# Patient Record
Sex: Female | Born: 1965 | Race: White | Hispanic: No | Marital: Married | Smoking: Never smoker
Health system: Southern US, Community
[De-identification: ages and names within clinical notes are randomized; demographics above are authoritative.]

## PROBLEM LIST (undated history)

## (undated) DIAGNOSIS — C801 Malignant (primary) neoplasm, unspecified: Secondary | ICD-10-CM

## (undated) HISTORY — PX: BREAST BIOPSY: SHX20

## (undated) HISTORY — PX: BREAST EXCISIONAL BIOPSY: SUR124

---

## 2006-04-27 ENCOUNTER — Ambulatory Visit: Payer: Self-pay

## 2007-09-26 ENCOUNTER — Ambulatory Visit: Payer: Self-pay

## 2008-04-09 ENCOUNTER — Ambulatory Visit: Payer: Self-pay | Admitting: General Practice

## 2008-12-31 ENCOUNTER — Ambulatory Visit: Payer: Self-pay | Admitting: General Practice

## 2009-01-13 ENCOUNTER — Ambulatory Visit: Payer: Self-pay | Admitting: General Practice

## 2011-05-17 ENCOUNTER — Ambulatory Visit: Payer: Self-pay | Admitting: Internal Medicine

## 2011-12-08 ENCOUNTER — Ambulatory Visit: Payer: Self-pay | Admitting: Specialist

## 2011-12-09 ENCOUNTER — Ambulatory Visit: Payer: Self-pay | Admitting: Nurse Practitioner

## 2012-01-26 ENCOUNTER — Ambulatory Visit: Payer: Self-pay | Admitting: Internal Medicine

## 2012-02-02 ENCOUNTER — Ambulatory Visit: Payer: Self-pay | Admitting: Specialist

## 2012-02-15 ENCOUNTER — Ambulatory Visit: Payer: Self-pay | Admitting: Specialist

## 2012-06-14 ENCOUNTER — Ambulatory Visit: Payer: Self-pay | Admitting: Internal Medicine

## 2012-11-30 ENCOUNTER — Ambulatory Visit: Payer: Self-pay | Admitting: Adult Health

## 2013-02-04 ENCOUNTER — Ambulatory Visit: Payer: Self-pay | Admitting: Family Medicine

## 2014-02-05 ENCOUNTER — Ambulatory Visit: Payer: Self-pay | Admitting: Family Medicine

## 2014-02-11 ENCOUNTER — Ambulatory Visit: Payer: Self-pay | Admitting: Family Medicine

## 2014-05-09 NOTE — Op Note (Signed)
PATIENT NAME:  Sandy Cohen, Sandy Cohen MR#:  [id number] DATE OF BIRTH:  14-Apr-1965  DATE OF PROCEDURE:  02/15/2012  PREOPERATIVE DIAGNOSIS:   1.  Large V-shaped tear of the right rotator cuff (supraspinatus and infraspinatus).  2.  Arthritis of the right acromioclavicular joint.  3.  Severe impingement syndrome, right subacromial space, right shoulder.   POSTOPERATIVE DIAGNOSIS:   1.   Large V-shaped tear of the right rotator cuff (supraspinatus and infraspinatus).  2.  Arthritis of the right acromioclavicular joint.   3.  Severe impingement syndrome, right subacromial space, right shoulder.   PROCEDURES: 1.  Arthroscopic right rotator cuff repair.  2.  Arthroscopic right distal clavicle excision.  3.  Arthroscopic right shoulder subacromial decompression and partial acromioplasty.   SURGEON: Park Breed, M.D.   ANESTHESIA: General endotracheal plus interscalene block.   COMPLICATIONS: None.   DRAINS: None.   ESTIMATED BLOOD LOSS: Minimal.   REPLACEMENTS: None.   OPERATIVE FINDINGS: The patient had a large V-shaped tear of the rotator cuff starting just behind the biceps tendon and extending posteriorly into the infraspinatus. The cuff had retracted mostly posteriorly. The subacromial space showed extensive bursitis and prominence of the anterior acromion. There was spurring beneath the acromion. The glenohumeral joint showed  intact articular surfaces. The labrum was slightly frayed. The subscapularis was intact. The biceps tendon was normal. There was a large tear visible of the cuff visible from underneath.   DESCRIPTION OF PROCEDURE: The patient was brought to the operating room where she underwent satisfactory general endotracheal anesthesia, after a right interscalene block had been instilled. She was placed in the left lateral decubitus position and padded appropriately on the beanbag. The right shoulder was prepped and draped in sterile fashion and placed in 12 pounds of traction.  Arthroscopy was carried out from posterior portal with accessory portals laterally and anteriorly. The above findings were encountered upon entering the glenohumeral joint and the bursa. The large V-shaped tear edges were debrided with motorized shaver. The labrum was gently debrided. The arthroscope was redirected into the subacromial space and the bare area of the tuberosity, where a tendon had detached was freed of soft tissue and bur used to remove some of the superficial bone. Arthroscope was redirected to the lateral portal and the large bur was brought in posteriorly and the anterior acromion was removed along with some of the distal clavicle. The bur was then placed through an anterior portal and remainder of the distal clavicle excised. Once all the debridement had been carried out, it appeared that several convergence stitches would be necessary to pull the front and anterior and posterior flaps of the cuff back together before anchoring it to the bone. Using Magnum wire  sutures, 4 sutures were placed starting medially and coming laterally into the cuff reapproximating the anterior and posterior portions. A 5.5 speed screw was drilled, and a hole was tapped and the screw was inserted into the anterolateral portion of the tuberosity. Another Magnum wire suture was placed through the posterior cuff and the sutures tightened and brought down into the speed screw and with traction removed, the speed screw was deployed, bringing the good thick  posterior cuff up over the tuberosity snugly. This eliminated the defect in the lateral portion of the cuff. Once this was completed, the  joint was again irrigated and the stab wounds closed with a 3-0 nylon suture. 0.25% Marcaine with epinephrine and morphine was placed in the joint and the bursa. A dry sterile  dressing with TENS pads and a sling were applied. The patient was awakened and taken to recovery in good condition.   ____________________________ Park Breed, MD hem:cc D: 02/15/2012 16:50:18 ET T: 02/15/2012 18:07:14 ET JOB#: [id number]  cc: Park Breed, MD, <Dictator> Park Breed MD ELECTRONICALLY SIGNED 02/16/2012 13:28

## 2018-11-16 ENCOUNTER — Telehealth: Payer: Self-pay

## 2018-11-16 NOTE — Telephone Encounter (Signed)
Two attempts. Pre-visit screening call prior to [facility] appointment on 11/20/2018.

## 2018-11-19 ENCOUNTER — Other Ambulatory Visit: Payer: Self-pay

## 2018-11-20 ENCOUNTER — Other Ambulatory Visit: Payer: Self-pay

## 2018-11-20 ENCOUNTER — Other Ambulatory Visit: Payer: Self-pay | Admitting: *Deleted

## 2018-11-20 ENCOUNTER — Inpatient Hospital Stay
Admission: RE | Admit: 2018-11-20 | Discharge: 2018-11-20 | Disposition: A | Discharge status: Home / Self Care | Payer: Self-pay | Source: Ambulatory Visit | Attending: *Deleted | Admitting: *Deleted

## 2018-11-20 ENCOUNTER — Ambulatory Visit
Admission: RE | Admit: 2018-11-20 | Discharge: 2018-11-20 | Disposition: A | Discharge status: Home / Self Care | Payer: Self-pay | Source: Ambulatory Visit | Attending: Oncology | Admitting: Oncology

## 2018-11-20 ENCOUNTER — Ambulatory Visit: Payer: Self-pay | Attending: Oncology

## 2018-11-20 VITALS — BP 121/66 | HR 71 | Temp 97.4°F | Ht 64.0 in | Wt 249.0 lb

## 2018-11-20 DIAGNOSIS — Z1231 Encounter for screening mammogram for malignant neoplasm of breast: Secondary | ICD-10-CM

## 2018-11-20 DIAGNOSIS — Z0000 Encounter for general adult medical examination without abnormal findings: Secondary | ICD-10-CM | POA: Insufficient documentation

## 2018-11-20 HISTORY — DX: Malignant (primary) neoplasm, unspecified: C80.1

## 2018-11-20 NOTE — Progress Notes (Signed)
  Subjective:     Patient ID: Sandy Cohen, female   DOB: 04-25-1965, 53 y.o.   MRN: [id number]  HPI   Review of Systems     Objective:   Physical Exam Chest:     Breasts:        Right: No swelling, bleeding, inverted nipple, mass, nipple discharge, skin change or tenderness.        Left: No swelling, bleeding, inverted nipple, mass, nipple discharge, skin change or tenderness.       Comments: Biopsy scar       Assessment:     53 year old patient presents for BCCCP clinic visit.  Patient screened, and meets BCCCP eligibility.  Patient does not have insurance, Medicare or Medicaid. Instructed patient on breast self awareness using teach back method. Clinical breast exam unremarkable.  No mass or lump palpated.    Plan:     Sent for bilateral screening mammogram.

## 2018-12-25 ENCOUNTER — Encounter: Payer: Self-pay | Admitting: *Deleted

## 2019-01-07 NOTE — Progress Notes (Signed)
Letter mailed from [facility] to notify of normal mammogram results.  Patient to return in one year for annual screening.  Copy to [facility]. 

## 2020-05-05 ENCOUNTER — Other Ambulatory Visit: Payer: Self-pay | Admitting: Family Medicine

## 2020-05-05 DIAGNOSIS — Z1231 Encounter for screening mammogram for malignant neoplasm of breast: Secondary | ICD-10-CM

## 2020-06-02 ENCOUNTER — Ambulatory Visit
Admission: RE | Admit: 2020-06-02 | Discharge: 2020-06-02 | Disposition: A | Discharge status: Home / Self Care | Payer: Self-pay | Source: Ambulatory Visit | Attending: Oncology | Admitting: Oncology

## 2020-06-02 ENCOUNTER — Other Ambulatory Visit: Payer: Self-pay

## 2020-06-02 ENCOUNTER — Ambulatory Visit: Payer: Medicaid Other | Attending: Oncology

## 2020-06-02 VITALS — BP 128/78 | HR 64 | Temp 97.2°F | Ht 64.0 in | Wt 251.0 lb

## 2020-06-02 DIAGNOSIS — Z0000 Encounter for general adult medical examination without abnormal findings: Secondary | ICD-10-CM | POA: Insufficient documentation

## 2020-06-02 NOTE — Progress Notes (Signed)
  Subjective:     Patient ID: Sandy Cohen, female   DOB: 1965-11-21, 55 y.o.   MRN: [id number]  HPI   Review of Systems     Objective:   Physical Exam Chest:  Breasts:     Right: No swelling, bleeding, inverted nipple, mass, nipple discharge, skin change or tenderness.     Left: No swelling, bleeding, inverted nipple, mass, nipple discharge, skin change or tenderness.          Assessment:     55 year old patient presents for B[facility]CP clinic visit. Patient screened, and meets BCCCP eligibility.  Patient does not have insurance, Medicare or Medicaid. Instructed patient on breast self awareness using teach back method.  Clinical breast exam unremarkable.  No mass or lump palpated.   Risk Assessment    Risk Scores      06/02/2020 11/20/2018   Last edited by: Rico Junker, RN Rico Junker, RN   5-year risk: 1.8 % 1.6 %   Lifetime risk: 11.4 % 11.9 %        Also completed Harriett Rush.      Plan:     Sent for bilateral screening mammogram.

## 2020-06-03 NOTE — Progress Notes (Signed)
Letter mailed from [facility] to notify of normal mammogram results.  Patient to return in one year for annual screening.  Copy to [facility]. 

## 2021-12-17 ENCOUNTER — Other Ambulatory Visit: Payer: Self-pay

## 2021-12-17 DIAGNOSIS — Z1231 Encounter for screening mammogram for malignant neoplasm of breast: Secondary | ICD-10-CM

## 2021-12-20 ENCOUNTER — Ambulatory Visit
Admission: RE | Admit: 2021-12-20 | Discharge: 2021-12-20 | Disposition: A | Discharge status: Home / Self Care | Payer: Medicaid Other | Source: Ambulatory Visit | Attending: Obstetrics and Gynecology | Admitting: Obstetrics and Gynecology

## 2021-12-20 ENCOUNTER — Ambulatory Visit: Payer: Medicaid Other | Attending: Hematology and Oncology | Admitting: Hematology and Oncology

## 2021-12-20 VITALS — BP 134/81 | Wt 250.8 lb

## 2021-12-20 DIAGNOSIS — Z1231 Encounter for screening mammogram for malignant neoplasm of breast: Secondary | ICD-10-CM

## 2021-12-20 NOTE — Patient Instructions (Signed)
[place] about self breast awareness. Patient did not need a Pap smear today due to hysterectomy in 1995. [facility]. Appointment scheduled for 12/20/21. Patient aware of appointment and will be there. Let patient know will follow up with her within the next couple weeks with results. DEVON KINGDON verbalized understanding.  Melodye Ped, NP 1:46 PM

## 2021-12-20 NOTE — Progress Notes (Signed)
Sandy Cohen is a 56 y.o. female who presents to [facility] clinic today with no complaints.    Pap Smear: Pap smear not completed today. She had hysterectomy in 1995 for benign fibroids.    Physical exam: Breasts Breasts symmetrical. No skin abnormalities bilateral breasts. No nipple retraction bilateral breasts. No nipple discharge bilateral breasts. No lymphadenopathy. No lumps palpated bilateral breasts.     MS DIGITAL SCREENING TOMO BILATERAL  Result Date: 06/02/2020 CLINICAL DATA:  Screening. EXAM: DIGITAL SCREENING BILATERAL MAMMOGRAM WITH TOMOSYNTHESIS AND CAD TECHNIQUE: Bilateral screening digital craniocaudal and mediolateral oblique mammograms were obtained. Bilateral screening digital breast tomosynthesis was performed. The images were evaluated with computer-aided detection. COMPARISON:  Previous exam(s). ACR Breast Density Category b: There are scattered areas of fibroglandular density. FINDINGS: There are no findings suspicious for malignancy. The images were evaluated with computer-aided detection. IMPRESSION: No mammographic evidence of malignancy. A result letter of this screening mammogram will be mailed directly to the patient. RECOMMENDATION: Screening mammogram in one year. (Code:SM-B-01Y) BI-RADS CATEGORY  1: Negative. Electronically Signed   By: Ammie Ferrier M.D.   On: 06/02/2020 14:30   MS DIGITAL SCREENING TOMO BILATERAL  Result Date: 11/20/2018 CLINICAL DATA:  Screening. EXAM: DIGITAL SCREENING BILATERAL MAMMOGRAM WITH TOMO AND CAD COMPARISON:  Previous exam(s). ACR Breast Density Category b: There are scattered areas of fibroglandular density. FINDINGS: There are no findings suspicious for malignancy. Images were processed with CAD. IMPRESSION: No mammographic evidence of malignancy. A result letter of this screening mammogram will be mailed directly to the patient. RECOMMENDATION: Screening mammogram in one year. (Code:SM-B-01Y) BI-RADS CATEGORY  1: Negative.  Electronically Signed   By: Lajean Manes M.D.   On: 11/20/2018 14:32      Pelvic/Bimanual Pap is not indicated today    Smoking History: Patient has never smoked and was not referred to quit line.    Patient Navigation: Patient education provided. Access to services provided for patient through [facility] program. No interpreter provided. No transportation provided   Colorectal Cancer Screening: Per patient has had colonoscopy completed on 02/13/17 and is due soon for follow up. Right colon polyp-tubular adenoma, Rectal polyp- inflammatory polyp, negative for dysplasia.  She will call her PCP for appointment.  No complaints today.    Breast and Cervical Cancer Risk Assessment: Patient has family history of breast cancer, paternal grandmother. Patient does not have history of cervical dysplasia, immunocompromised, or DES exposure in-utero.  Risk Scores as of 12/20/2021     Sandy Cohen           5-year 1.49 %   Lifetime 9.08 %            Last calculated by Demetrius Revel, LPN on 88/01/1029 at  1:34 PM        A: B[facility]CP exam without pap smear No complaints with benign exam.   P: Referred patient to the [facility] for a screening mammogram. Appointment scheduled 12/20/21.  Melodye Ped, NP 12/20/2021 1:41 PM

## 2022-02-06 IMAGING — MG MM DIGITAL SCREENING BILAT W/ TOMO AND CAD
8 series · 8 of 24 positions shown · non-contrast
Comparison: Previous exam(s).

CLINICAL DATA: Screening.

EXAM:
DIGITAL SCREENING BILATERAL MAMMOGRAM WITH TOMOSYNTHESIS AND CAD
TECHNIQUE: Bilateral screening digital craniocaudal and mediolateral oblique
mammograms were obtained. Bilateral screening digital breast
tomosynthesis was performed. The images were evaluated with
computer-aided detection.

[L MLO synth-2D]
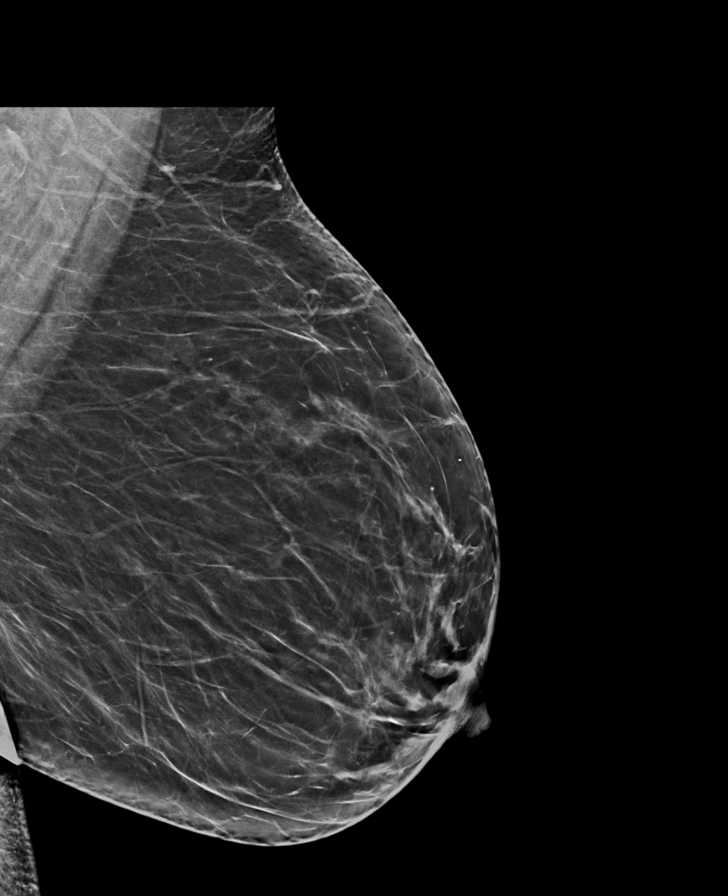

[R CC synth-2D]
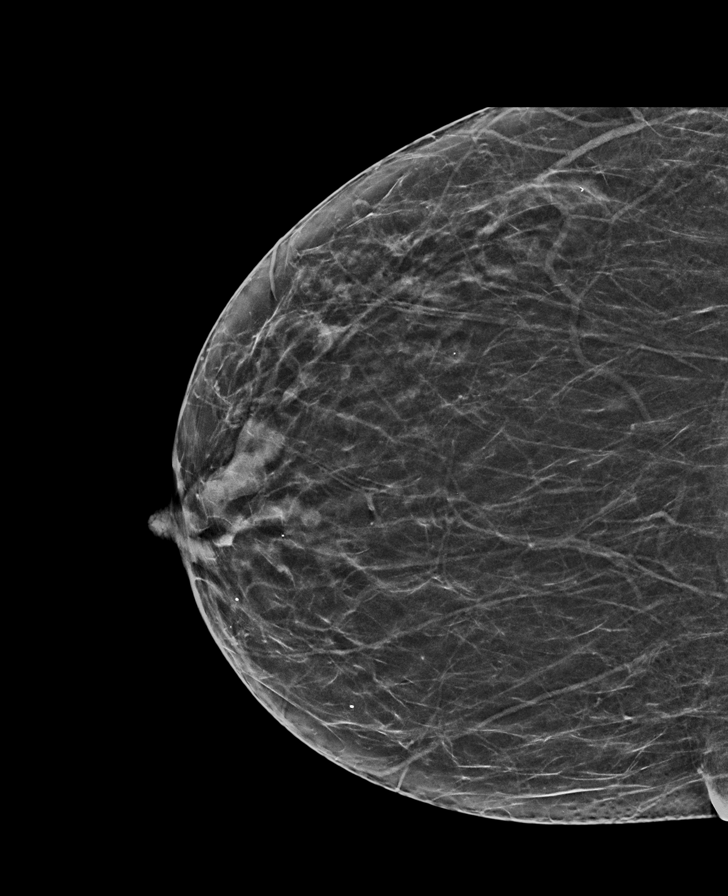

[R MLO synth-2D]
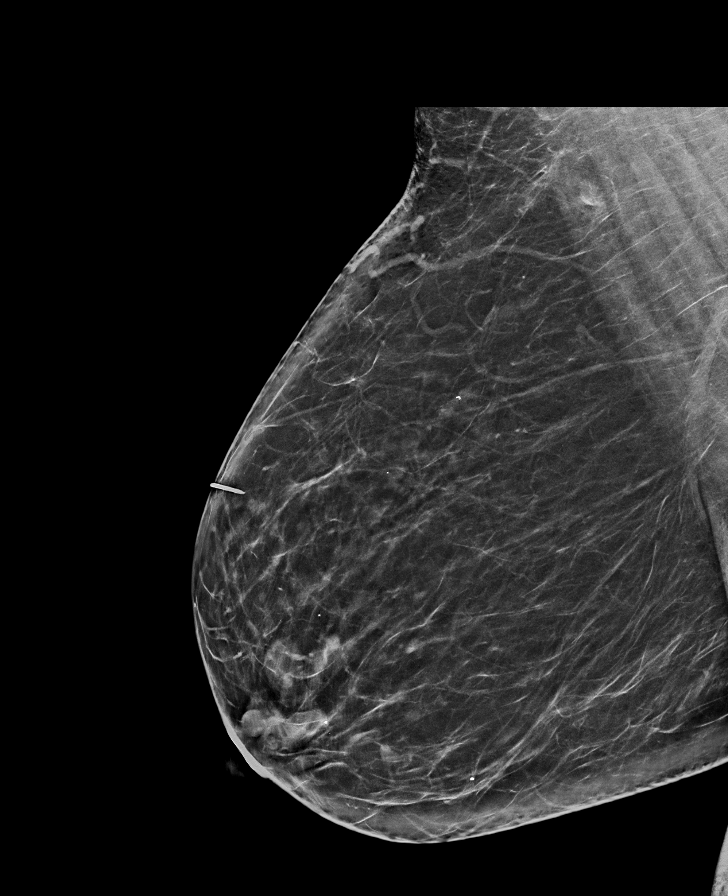

[L CC synth-2D]
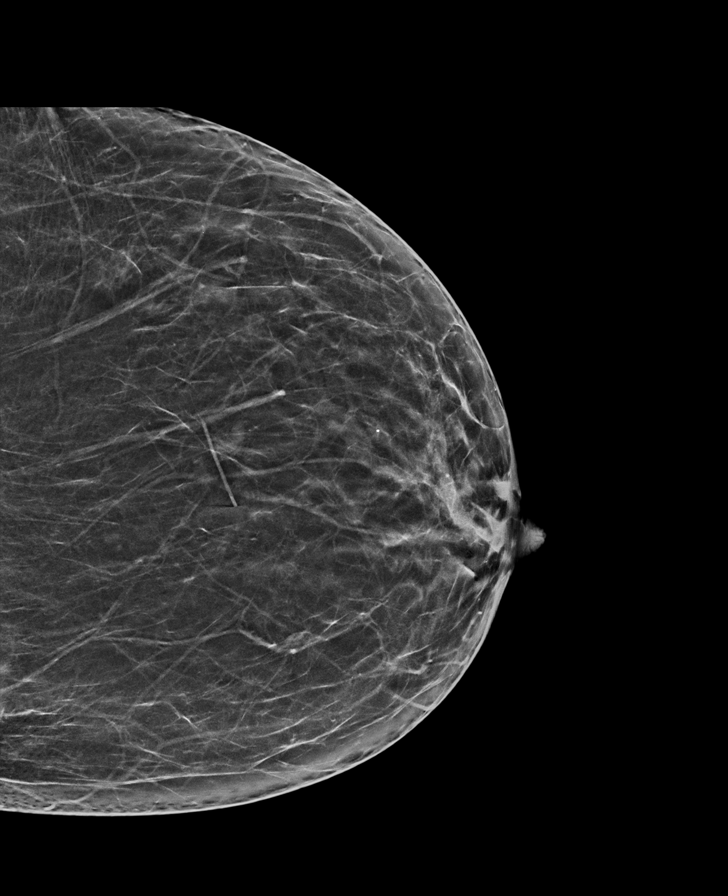

[L MLO tomo · tomo slice 37/73.0]
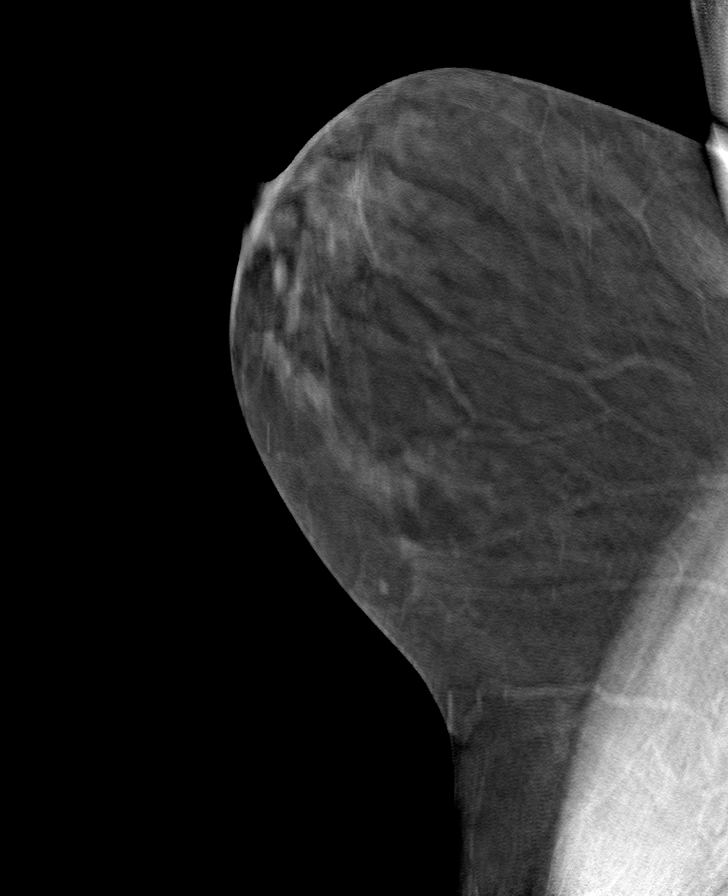

[R CC tomo · tomo slice 31/60.0]
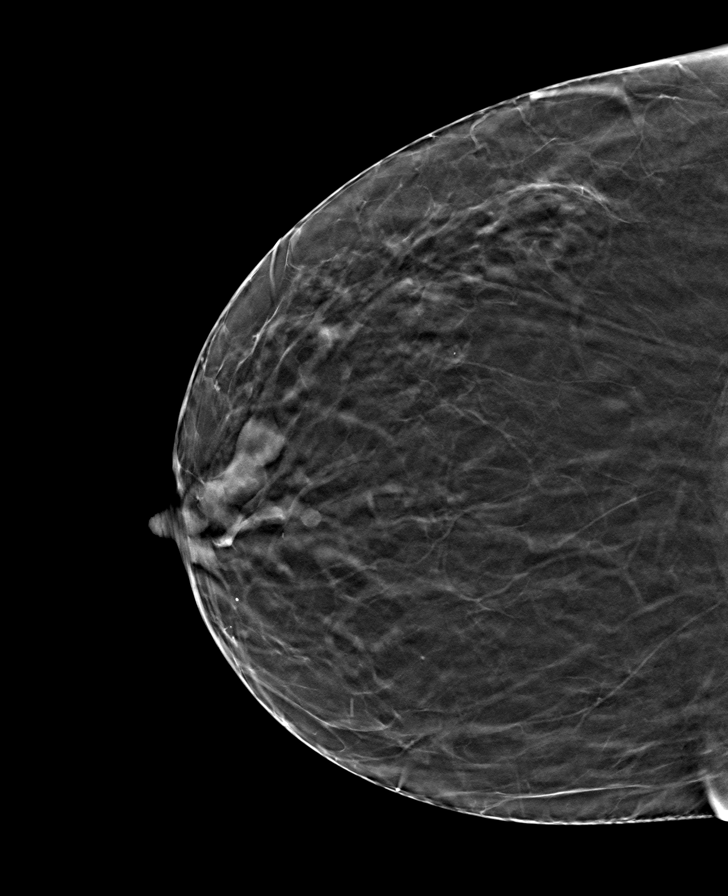

[R MLO tomo · tomo slice 39/76.0]
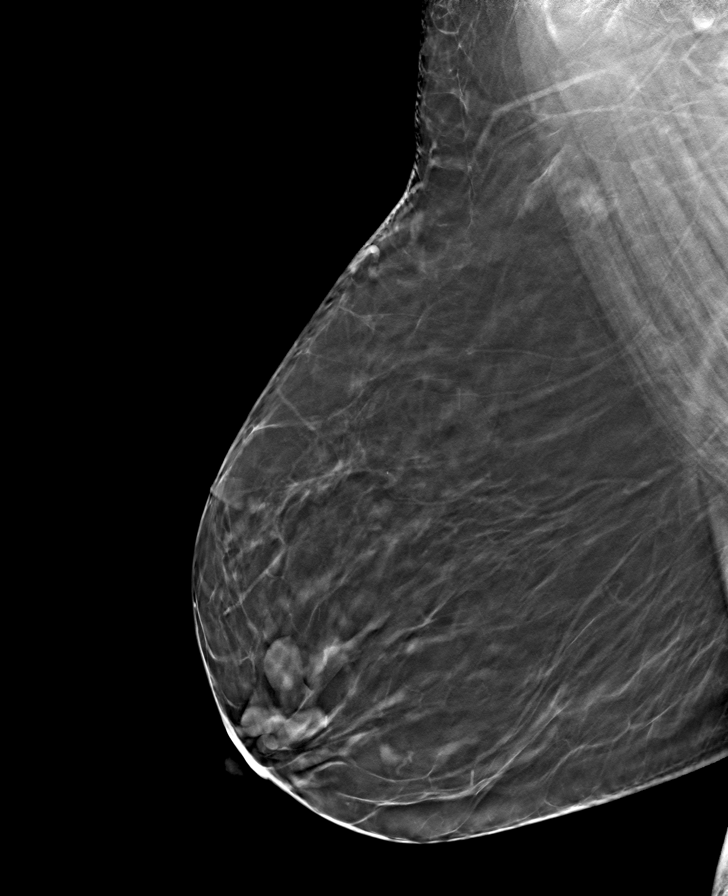

[L CC tomo · tomo slice 33/64.0]
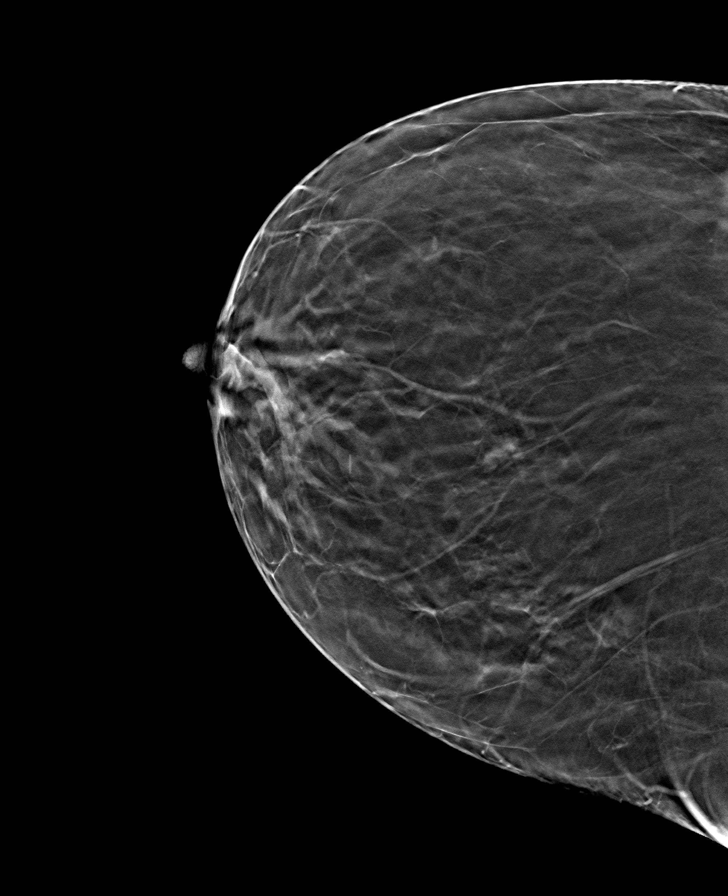

[8 of 24 positions shown; findings below may reference images not displayed]

ACR Breast Density Category b: There are scattered areas of
fibroglandular density.
FINDINGS: There are no findings suspicious for malignancy. The images were
evaluated with computer-aided detection.
IMPRESSION: No mammographic evidence of malignancy. A result letter of this
screening mammogram will be mailed directly to the patient.

RECOMMENDATION:
Screening mammogram in one year. (Code:WJ-I-BG6)

BI-RADS CATEGORY  1: Negative.

## 2023-09-19 ENCOUNTER — Other Ambulatory Visit: Payer: Self-pay

## 2023-09-19 DIAGNOSIS — Z1231 Encounter for screening mammogram for malignant neoplasm of breast: Secondary | ICD-10-CM

## 2023-10-16 ENCOUNTER — Other Ambulatory Visit: Payer: Self-pay

## 2023-10-16 ENCOUNTER — Ambulatory Visit: Payer: Self-pay

## 2023-10-16 ENCOUNTER — Ambulatory Visit: Payer: Self-pay | Attending: Obstetrics and Gynecology | Admitting: *Deleted

## 2023-10-16 VITALS — BP 132/69 | Ht 64.0 in | Wt 250.0 lb

## 2023-10-16 DIAGNOSIS — R2232 Localized swelling, mass and lump, left upper limb: Secondary | ICD-10-CM

## 2023-10-16 DIAGNOSIS — Z1239 Encounter for other screening for malignant neoplasm of breast: Secondary | ICD-10-CM

## 2023-10-16 DIAGNOSIS — N6321 Unspecified lump in the left breast, upper outer quadrant: Secondary | ICD-10-CM

## 2023-10-16 NOTE — Patient Instructions (Signed)
 Explained breast self awareness with Sandy Cohen. Patient did not need a Pap smear today due to patient has a history of a hysterectomy for benign reasons. Let patient know that she doesn't need any further Pap smears due to her history of a hysterectomy for benign reasons. Referred patient to the [facility] for a diagnostic mammogram. Appointment scheduled Tuesday, October 17, 2023 at 0920. Patient aware of appointment and will be there. [name] verbalized understanding.  Govani Radloff, Wanda Ship, RN 2:43 PM

## 2023-10-16 NOTE — Progress Notes (Signed)
 [name] is a 58 y.o. female who presents to [facility] clinic today with no complaints.    Pap Smear: Pap smear not completed today. Last Pap smear was in 1996 and was normal prt patient. Per patient has history of an abnormal Pap smear when she was 58 years old that a colposcopy was completed for follow up. Per patient all Pap smears have been normal since and she has had at least three normal Pap smears. Patient has a history of a hysterectomy in 1995 related to fibroids and AUB. Patient doesn't need any further Pap smears per BCCCP and ASCCP guidelines due to her history of a hysterectomy for benign reasons. Last Pap smear result is not available in Epic.   Physical exam: Breasts Right breast is larger than left breast that per patient is normal for her. No skin abnormalities bilateral breasts. No nipple retraction bilateral breasts. No nipple discharge bilateral breasts. No lymphadenopathy. No lumps palpated right breast. Palpated a lump within the left axilla at 1 o'clock 20 cm from the nipple. No complaints of pain or tenderness on exam.  MS DIGITAL SCREENING TOMO BILATERAL Result Date: 12/21/2021 CLINICAL DATA:  Screening. EXAM: DIGITAL SCREENING BILATERAL MAMMOGRAM WITH TOMOSYNTHESIS AND CAD TECHNIQUE: Bilateral screening digital craniocaudal and mediolateral oblique mammograms were obtained. Bilateral screening digital breast tomosynthesis was performed. The images were evaluated with computer-aided detection. COMPARISON:  Previous exam(s). ACR Breast Density Category b: There are scattered areas of fibroglandular density. FINDINGS: There are no findings suspicious for malignancy. IMPRESSION: No mammographic evidence of malignancy. A result letter of this screening mammogram will be mailed directly to the patient. RECOMMENDATION: Screening mammogram in one year. (Code:SM-B-01Y) BI-RADS CATEGORY  1: Negative. Electronically Signed   By: Almarie Daring M.D.   On: 12/21/2021 15:09   MS  DIGITAL SCREENING TOMO BILATERAL Result Date: 06/02/2020 CLINICAL DATA:  Screening. EXAM: DIGITAL SCREENING BILATERAL MAMMOGRAM WITH TOMOSYNTHESIS AND CAD TECHNIQUE: Bilateral screening digital craniocaudal and mediolateral oblique mammograms were obtained. Bilateral screening digital breast tomosynthesis was performed. The images were evaluated with computer-aided detection. COMPARISON:  Previous exam(s). ACR Breast Density Category b: There are scattered areas of fibroglandular density. FINDINGS: There are no findings suspicious for malignancy. The images were evaluated with computer-aided detection. IMPRESSION: No mammographic evidence of malignancy. A result letter of this screening mammogram will be mailed directly to the patient. RECOMMENDATION: Screening mammogram in one year. (Code:SM-B-01Y) BI-RADS CATEGORY  1: Negative. Electronically Signed   By: Rosaline Collet M.D.   On: 06/02/2020 14:30   MS DIGITAL SCREENING TOMO BILATERAL Result Date: 11/20/2018 CLINICAL DATA:  Screening. EXAM: DIGITAL SCREENING BILATERAL MAMMOGRAM WITH TOMO AND CAD COMPARISON:  Previous exam(s). ACR Breast Density Category b: There are scattered areas of fibroglandular density. FINDINGS: There are no findings suspicious for malignancy. Images were processed with CAD. IMPRESSION: No mammographic evidence of malignancy. A result letter of this screening mammogram will be mailed directly to the patient. RECOMMENDATION: Screening mammogram in one year. (Code:SM-B-01Y) BI-RADS CATEGORY  1: Negative. Electronically Signed   By: Alm Parkins M.D.   On: 11/20/2018 14:32    Pelvic/Bimanual Pap is not indicated today per BCCCP guidelines.   Smoking History: Patient has never smoked.   Patient Navigation: Patient education provided. Access to services provided for patient through [facility] program.   Colorectal Cancer Screening: Per patient has had colonoscopy completed on 09/08/2022. No complaints today.    Breast and Cervical  Cancer Risk Assessment: Patient has family history of her paternal  grandmother having breast cancer. Patient has no known genetic mutations or history of radiation treatment to the chest before age 1. Per patient has history of cervical dysplasia. Patient has no history of being immunocompromised or DES exposure in-utero.  Risk Scores as of Encounter on 10/16/2023     Sandy Cohen           5-year 1.61%   Lifetime 8.67%            Last calculated by Sandy Cohen, Sandy Cohen, Sandy Cohen on 10/16/2023 at  2:43 PM        A: BCCCP exam without pap smear No complaints.  P: Referred patient to the [facility] for a diagnostic mammogram. Appointment scheduled Tuesday, October 17, 2023 at 0920.  Sandy Sandy Cohen SQUIBB, RN 10/16/2023 2:43 PM

## 2023-10-17 ENCOUNTER — Ambulatory Visit
Admission: RE | Admit: 2023-10-17 | Discharge: 2023-10-17 | Disposition: A | Discharge status: Home / Self Care | Payer: Self-pay | Source: Ambulatory Visit | Attending: Obstetrics and Gynecology | Admitting: Obstetrics and Gynecology

## 2023-10-17 ENCOUNTER — Other Ambulatory Visit: Payer: Self-pay | Admitting: Obstetrics and Gynecology

## 2023-10-17 DIAGNOSIS — N6321 Unspecified lump in the left breast, upper outer quadrant: Secondary | ICD-10-CM | POA: Insufficient documentation
